# Patient Record
Sex: Male | Born: 1990 | Race: Black or African American | Hispanic: No | Marital: Single | Smoking: Current every day smoker
Health system: Southern US, Community
[De-identification: ages and names within clinical notes are randomized; demographics above are authoritative.]

---
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
## 2014-02-10 ENCOUNTER — Emergency Department (HOSPITAL_COMMUNITY)
Admission: EM | Admit: 2014-02-10 | Discharge: 2014-02-10 | Disposition: A | Discharge status: Home / Self Care | Payer: No Typology Code available for payment source | Attending: Emergency Medicine | Admitting: Emergency Medicine

## 2014-02-10 ENCOUNTER — Encounter (HOSPITAL_COMMUNITY): Payer: Self-pay | Admitting: Emergency Medicine

## 2014-02-10 DIAGNOSIS — M545 Low back pain, unspecified: Secondary | ICD-10-CM | POA: Insufficient documentation

## 2014-02-10 DIAGNOSIS — Y92410 Unspecified street and highway as the place of occurrence of the external cause: Secondary | ICD-10-CM | POA: Insufficient documentation

## 2014-02-10 DIAGNOSIS — S0993XA Unspecified injury of face, initial encounter: Secondary | ICD-10-CM | POA: Insufficient documentation

## 2014-02-10 DIAGNOSIS — Y9389 Activity, other specified: Secondary | ICD-10-CM | POA: Insufficient documentation

## 2014-02-10 DIAGNOSIS — F17200 Nicotine dependence, unspecified, uncomplicated: Secondary | ICD-10-CM | POA: Insufficient documentation

## 2014-02-10 DIAGNOSIS — S199XXA Unspecified injury of neck, initial encounter: Principal | ICD-10-CM

## 2014-02-10 MED ORDER — IBUPROFEN 200 MG PO TABS: 600.0000 mg | ORAL_TABLET | Freq: Once | ORAL | Status: DC

## 2014-02-10 MED ORDER — IBUPROFEN 600 MG PO TABS: 600.0000 mg | ORAL_TABLET | Freq: Four times a day (QID) | ORAL | Status: DC | PRN
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
## 2014-02-11 NOTE — ED Provider Notes (Signed)
Medical screening examination/treatment/procedure(s) were performed by non-physician practitioner and as supervising physician I was immediately available for consultation/collaboration.   Ashan Cueva L Kaydynce Pat, MD 02/11/14 1250 

## 2021-12-10 ENCOUNTER — Encounter (HOSPITAL_COMMUNITY): Payer: Self-pay | Admitting: Emergency Medicine

## 2021-12-10 ENCOUNTER — Ambulatory Visit (HOSPITAL_COMMUNITY)
Admission: EM | Admit: 2021-12-10 | Discharge: 2021-12-10 | Disposition: A | Discharge status: Home / Self Care | Payer: BC Managed Care – PPO | Attending: Internal Medicine | Admitting: Internal Medicine

## 2021-12-10 ENCOUNTER — Other Ambulatory Visit: Payer: Self-pay

## 2021-12-10 DIAGNOSIS — L739 Follicular disorder, unspecified: Secondary | ICD-10-CM

## 2021-12-10 MED ORDER — IBUPROFEN 600 MG PO TABS: 600.0000 mg | ORAL_TABLET | Freq: Four times a day (QID) | ORAL | 0 refills | Status: DC | PRN

## 2021-12-10 MED ORDER — DOXYCYCLINE HYCLATE 100 MG PO CAPS: 100.0000 mg | ORAL_CAPSULE | Freq: Two times a day (BID) | ORAL | 0 refills | Status: AC

## 2021-12-10 NOTE — ED Triage Notes (Signed)
Pt c/o knot in left axilla for a couple days. Reports pain. Denies drainage.

## 2021-12-10 NOTE — Discharge Instructions (Addendum)
Warm compress Take ibuprofen as needed for pain Take antibiotics If you experience worsening pain, increasing size and swelling or fever please return to urgent care to be reevaluated.

## 2021-12-13 NOTE — ED Provider Notes (Signed)
[facility]    CSN: [id number] Arrival date & time: 12/10/21  1541      History   Chief Complaint Chief Complaint  Patient presents with   Abscess    HPI John Ballard is a 31 y.o. male comes to the urgent care with 2-day history of painful swelling in the left axilla.  No discharge.  No trauma to the left axilla.  Patient denies any history of similar episodes in the past.  Pain is throbbing, constant, aggravated by palpation with no known relieving factors.  He has not tried any over-the-counter medications.   HPI  History reviewed. No pertinent past medical history.  There are no problems to display for this patient.   History reviewed. No pertinent surgical history.     Home Medications    Prior to Admission medications   Medication Sig Start Date End Date Taking? Authorizing Provider  doxycycline (VIBRAMYCIN) 100 MG capsule Take 1 capsule (100 mg total) by mouth 2 (two) times daily for 7 days. 12/10/21 12/17/21 Yes Muhammed Teutsch, Myrene Galas, MD  ibuprofen (ADVIL) 600 MG tablet Take 1 tablet (600 mg total) by mouth every 6 (six) hours as needed. 12/10/21   Aahna Rossa, Myrene Galas, MD    Family History No family history on file.  Social History Social History   Tobacco Use   Smoking status: Every Day  Substance Use Topics   Alcohol use: Yes     Allergies   Patient has no known allergies.   Review of Systems Review of Systems  Skin:  Negative for color change, pallor, rash and wound.    Physical Exam Triage Vital Signs ED Triage Vitals  Enc Vitals Group     BP 12/10/21 1632 105/69     Pulse Rate 12/10/21 1632 68     Resp 12/10/21 1632 14     Temp 12/10/21 1632 98.1 F (36.7 C)     Temp Source 12/10/21 1632 Oral     SpO2 12/10/21 1632 97 %     Weight --      Height --      Head Circumference --      Peak Flow --      Pain Score 12/10/21 1630 3     Pain Loc --      Pain Edu? --      Excl. in [place]? --    No data found.  Updated Vital  Signs BP 105/69    Pulse 68    Temp 98.1 F (36.7 C) (Oral)    Resp 14    SpO2 97%   Visual Acuity Right Eye Distance:   Left Eye Distance:   Bilateral Distance:    Right Eye Near:   Left Eye Near:    Bilateral Near:     Physical Exam Vitals and nursing note reviewed.  Constitutional:      General: He is not in acute distress.    Appearance: He is not ill-appearing.  Cardiovascular:     Rate and Rhythm: Normal rate and regular rhythm.  Skin:    Comments: Tender nodule in the left axilla.  No fluctuance noted.  No discharge noted.  No overlying erythema.  Nodule measures about half an inch in the longest diameter.  It is not attached to underlying tissue.  Neurological:     Mental Status: He is alert.     UC Treatments / Results  Labs (all labs ordered are listed, but only abnormal results are displayed) Labs  Reviewed - No data to display  EKG   Radiology No results found.  Procedures Procedures (including critical care time)  Medications Ordered in UC Medications - No data to display  Initial Impression / Assessment and Plan / UC Course  I have reviewed the triage vital signs and the nursing notes.  Pertinent labs & imaging results that were available during my care of the patient were reviewed by me and considered in my medical decision making (see chart for details).     1.  Folliculitis of the left axilla: Warm compress Ibuprofen as needed for pain Doxycycline 100 mg twice daily for 7 days Return to urgent care if pain and swelling worsens or if there is drainable abscess Final Clinical Impressions(s) / UC Diagnoses   Final diagnoses:  Folliculitis of left axilla     Discharge Instructions      Warm compress Take ibuprofen as needed for pain Take antibiotics If you experience worsening pain, increasing size and swelling or fever please return to urgent care to be reevaluated.   ED Prescriptions     Medication Sig Dispense Auth. Provider    doxycycline (VIBRAMYCIN) 100 MG capsule Take 1 capsule (100 mg total) by mouth 2 (two) times daily for 7 days. 14 capsule Yousra Ivens, Myrene Galas, MD   ibuprofen (ADVIL) 600 MG tablet Take 1 tablet (600 mg total) by mouth every 6 (six) hours as needed. 30 tablet Patrisha Hausmann, Myrene Galas, MD      PDMP not reviewed this encounter.   Chase Picket, MD 12/13/21 1728

## 2021-12-21 ENCOUNTER — Other Ambulatory Visit: Payer: Self-pay

## 2021-12-21 ENCOUNTER — Ambulatory Visit (HOSPITAL_COMMUNITY)
Admission: EM | Admit: 2021-12-21 | Discharge: 2021-12-21 | Disposition: A | Discharge status: Home / Self Care | Payer: BC Managed Care – PPO | Attending: Family Medicine | Admitting: Family Medicine

## 2021-12-21 ENCOUNTER — Encounter (HOSPITAL_COMMUNITY): Payer: Self-pay

## 2021-12-21 DIAGNOSIS — L739 Follicular disorder, unspecified: Secondary | ICD-10-CM | POA: Diagnosis not present

## 2021-12-21 NOTE — ED Triage Notes (Signed)
Pt c/o multiple knots under bilateral axilla for 3 weeks

## 2021-12-21 NOTE — ED Provider Notes (Signed)
[place]    CSN: [id number] Arrival date & time: 12/21/21  1153      History   Chief Complaint Chief Complaint  Patient presents with   Lymphadenopathy    HPI John Ballard is a 31 y.o. male.   He was taking a shower, and noted a lump under the left arm pit several weeks week.  Seen in the UC, given abx, recommended warm compresses.  The swelling went down, but did not go away;  the tenderness is better, but depends on what he is doing.  He noted a lump under the right arm pit in the last 2 days;  this is not swollen yet at this time.  No other enlarged lymph nodes or lumps anywhere else;  No fevers chills, feeling well otherwise;  For work he does a lot of overhead lifting, and wonders if that is related;  No open sores;   History reviewed. No pertinent past medical history.  There are no problems to display for this patient.   History reviewed. No pertinent surgical history.     Home Medications    Prior to Admission medications   Medication Sig Start Date End Date Taking? Authorizing Provider  ibuprofen (ADVIL) 600 MG tablet Take 1 tablet (600 mg total) by mouth every 6 (six) hours as needed. 12/10/21   Lamptey, Myrene Galas, MD    Family History History reviewed. No pertinent family history.  Social History Social History   Tobacco Use   Smoking status: Every Day  Substance Use Topics   Alcohol use: Yes     Allergies   Patient has no known allergies.   Review of Systems Review of Systems  Constitutional: Negative.   HENT: Negative.    Respiratory: Negative.    Cardiovascular: Negative.   Gastrointestinal: Negative.   Allergic/Immunologic: Negative.   Hematological:  Positive for adenopathy.    Physical Exam Triage Vital Signs ED Triage Vitals  Enc Vitals Group     BP 12/21/21 1202 120/75     Pulse Rate 12/21/21 1202 69     Resp 12/21/21 1202 14     Temp 12/21/21 1202 98.1 F (36.7 C)     Temp Source 12/21/21 1202 Oral      SpO2 12/21/21 1202 96 %     Weight --      Height --      Head Circumference --      Peak Flow --      Pain Score 12/21/21 1203 0     Pain Loc --      Pain Edu? --      Excl. in [place]? --    No data found.  Updated Vital Signs BP 120/75 (BP Location: Left Arm)    Pulse 69    Temp 98.1 F (36.7 C) (Oral)    Resp 14    SpO2 96%   Visual Acuity Right Eye Distance:   Left Eye Distance:   Bilateral Distance:    Right Eye Near:   Left Eye Near:    Bilateral Near:     Physical Exam Constitutional:      Appearance: Normal appearance.  Cardiovascular:     Rate and Rhythm: Normal rate and regular rhythm.  Pulmonary:     Effort: Pulmonary effort is normal.     Breath sounds: Normal breath sounds.  Musculoskeletal:     Cervical back: Normal range of motion and neck supple.  Lymphadenopathy:     Cervical: No  cervical adenopathy.     Upper Body:     Right upper body: No supraclavicular or axillary adenopathy.     Left upper body: No supraclavicular or axillary adenopathy.  Skin:    General: Skin is warm.     Comments: There are is one small superficial lump just under the skin on the right axilla;  non tender;  there is another superficial lump just under the skin at the left axilla; non tender;   Neurological:     Mental Status: He is alert.     UC Treatments / Results  Labs (all labs ordered are listed, but only abnormal results are displayed) Labs Reviewed - No data to display  EKG   Radiology No results found.  Procedures Procedures (including critical care time)  Medications Ordered in UC Medications - No data to display  Initial Impression / Assessment and Plan / UC Course  I have reviewed the triage vital signs and the nursing notes.  Pertinent labs & imaging results that were available during my care of the patient were reviewed by me and considered in my medical decision making (see chart for details).  Small superficial lumps under the skin bilaterally;   These are not lymph nodes;  These also do not seem to be infected at this time.  Will monitor for now if they do seem infected will follow up in the urgent care;    Final Clinical Impressions(s) / UC Diagnoses   Final diagnoses:  Folliculitis     Discharge Instructions      You were seen today for folliculitis of the armpits.  These are not enlarged lymph nodes.  There is no evidence of infection at this time so I will not give you an oral antibiotic.  One option is to trial over the counter wash, such as Hibiclens, to see if this helps.  This will be at your local pharmacy.  If any of these areas become infected (redness, warmth, tenderness) then please return to the urgent care.     ED Prescriptions   None    PDMP not reviewed this encounter.   Rondel Oh, MD 12/21/21 1234

## 2021-12-21 NOTE — Discharge Instructions (Signed)
You were seen today for folliculitis of the armpits.  These are not enlarged lymph nodes.  There is no evidence of infection at this time so I will not give you an oral antibiotic.  One option is to trial over the counter wash, such as Hibiclens, to see if this helps.  This will be at your local pharmacy.  If any of these areas become infected (redness, warmth, tenderness) then please return to the urgent care.

## 2022-01-07 ENCOUNTER — Ambulatory Visit (HOSPITAL_COMMUNITY)
Admission: EM | Admit: 2022-01-07 | Discharge: 2022-01-07 | Disposition: A | Discharge status: Home / Self Care | Payer: BC Managed Care – PPO | Attending: Family Medicine | Admitting: Family Medicine

## 2022-01-07 ENCOUNTER — Encounter (HOSPITAL_COMMUNITY): Payer: Self-pay | Admitting: Emergency Medicine

## 2022-01-07 ENCOUNTER — Other Ambulatory Visit: Payer: Self-pay

## 2022-01-07 DIAGNOSIS — M549 Dorsalgia, unspecified: Secondary | ICD-10-CM

## 2022-01-07 DIAGNOSIS — R509 Fever, unspecified: Secondary | ICD-10-CM | POA: Diagnosis not present

## 2022-01-07 LAB — POC INFLUENZA A AND B ANTIGEN (URGENT CARE ONLY)
INFLUENZA A ANTIGEN, POC: NEGATIVE
INFLUENZA B ANTIGEN, POC: NEGATIVE

## 2022-01-07 MED ORDER — KETOROLAC TROMETHAMINE 30 MG/ML IJ SOLN
INTRAMUSCULAR | Status: AC
  Filled 2022-01-07: qty 1

## 2022-01-07 MED ORDER — LIDOCAINE VISCOUS HCL 2 % MT SOLN
OROMUCOSAL | Status: AC
  Filled 2022-01-07: qty 15

## 2022-01-07 MED ORDER — IBUPROFEN 600 MG PO TABS: 600.0000 mg | ORAL_TABLET | Freq: Three times a day (TID) | ORAL | 0 refills | Status: AC | PRN

## 2022-01-07 MED ORDER — TIZANIDINE HCL 4 MG PO TABS: 4.0000 mg | ORAL_TABLET | Freq: Three times a day (TID) | ORAL | 0 refills | Status: AC | PRN

## 2022-01-07 MED ORDER — KETOROLAC TROMETHAMINE 30 MG/ML IJ SOLN
30.0000 mg | Freq: Once | INTRAMUSCULAR | Status: AC
  Administered 2022-01-07: 30 mg via INTRAMUSCULAR

## 2022-01-07 NOTE — ED Triage Notes (Signed)
Pt reports all over back pain that started this morning. Pt states he needs a refill for Ibuprofen.

## 2022-01-07 NOTE — Discharge Instructions (Addendum)
Ibuprofen 600 mg, 1 every 8 hours as needed for pain.  Tizanidine 4 mg 1 every 8 hours as needed for muscle pain or spasm.  You can use a heating pad on any sore areas.  You were given a shot of Toradol 30 mg today

## 2022-01-07 NOTE — ED Provider Notes (Signed)
[place]    CSN: [id number] Arrival date & time: 01/07/22  1121      History   Chief Complaint Chief Complaint  Patient presents with   Back Pain    HPI John Ballard is a 31 y.o. male.    Back Pain Here for sharp pain that begins in the nape of his neck and extends down to his lumbosacral area.  This began this morning.  No injury noted.  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  No upper respiratory symptoms.  He does note some fever and chills today.  No vomiting or diarrhea. No syncope so far.  History reviewed. No pertinent past medical history.  There are no problems to display for this patient.   History reviewed. No pertinent surgical history.     Home Medications    Prior to Admission medications   Medication Sig Start Date End Date Taking? Authorizing Provider  tiZANidine (ZANAFLEX) 4 MG tablet Take 1 tablet (4 mg total) by mouth every 8 (eight) hours as needed for muscle spasms. 01/07/22  Yes Barrett Henle, MD  ibuprofen (ADVIL) 600 MG tablet Take 1 tablet (600 mg total) by mouth every 8 (eight) hours as needed. 01/07/22   Barrett Henle, MD    Family History Family History  Problem Relation Age of Onset   Healthy Mother    Healthy Father     Social History Social History   Tobacco Use   Smoking status: Every Day  Substance Use Topics   Alcohol use: Yes     Allergies   Patient has no known allergies.   Review of Systems Review of Systems  Musculoskeletal:  Positive for back pain.    Physical Exam Triage Vital Signs ED Triage Vitals  Enc Vitals Group     BP 01/07/22 1222 123/72     Pulse Rate 01/07/22 1222 99     Resp 01/07/22 1222 16     Temp 01/07/22 1222 (!) 100.9 F (38.3 C)     Temp Source 01/07/22 1222 Oral     SpO2 01/07/22 1222 96 %     Weight 01/07/22 1220 119 lb 14.9 oz (54.4 kg)     Height 01/07/22 1220 5\' 8"  (1.727 m)     Head Circumference --      Peak Flow --       Pain Score 01/07/22 1220 8     Pain Loc --      Pain Edu? --      Excl. in [place]? --    No data found.  Updated Vital Signs BP 123/72 (BP Location: Right Arm)    Pulse 99    Temp (!) 100.9 F (38.3 C) (Oral)    Resp 16    Ht 5\' 8"  (1.727 m)    Wt 54.4 kg    SpO2 96%    BMI 18.24 kg/m   Visual Acuity Right Eye Distance:   Left Eye Distance:   Bilateral Distance:    Right Eye Near:   Left Eye Near:    Bilateral Near:     Physical Exam Vitals reviewed.  Constitutional:      General: He is not in acute distress.    Appearance: He is not toxic-appearing.  HENT:     Nose: Nose normal.     Mouth/Throat:     Mouth: Mucous membranes are moist.  Eyes:     Extraocular Movements: Extraocular movements  intact.     Pupils: Pupils are equal, round, and reactive to light.  Cardiovascular:     Rate and Rhythm: Normal rate.     Heart sounds: No murmur heard. Pulmonary:     Effort: Pulmonary effort is normal.     Breath sounds: Normal breath sounds. No wheezing or rhonchi.  Musculoskeletal:        General: Tenderness (upper and lower back) present.     Cervical back: Neck supple.  Lymphadenopathy:     Cervical: No cervical adenopathy.  Skin:    Coloration: Skin is not jaundiced or pale.  Neurological:     General: No focal deficit present.     Mental Status: He is alert and oriented to person, place, and time.  Psychiatric:        Behavior: Behavior normal.     UC Treatments / Results  Labs (all labs ordered are listed, but only abnormal results are displayed) Labs Reviewed  POC INFLUENZA A AND B ANTIGEN (URGENT CARE ONLY)    EKG   Radiology No results found.  Procedures Procedures (including critical care time)  Medications Ordered in UC Medications  ketorolac (TORADOL) 30 MG/ML injection 30 mg (30 mg Intramuscular Given 01/07/22 1259)    Initial Impression / Assessment and Plan / UC Course  I have reviewed the triage vital signs and the nursing  notes.  Pertinent labs & imaging results that were available during my care of the patient were reviewed by me and considered in my medical decision making (see chart for details).     And seems it was more chills from the pain and then fever that he had felt.  Flu test is negative.  We will treat with ibuprofen and tizanidine as needed. Final Clinical Impressions(s) / UC Diagnoses   Final diagnoses:  Acute back pain, unspecified back location, unspecified back pain laterality  Fever, unspecified     Discharge Instructions      Ibuprofen 600 mg, 1 every 8 hours as needed for pain.  Tizanidine 4 mg 1 every 8 hours as needed for muscle pain or spasm.  You can use a heating pad on any sore areas.  You were given a shot of Toradol 30 mg today     ED Prescriptions     Medication Sig Dispense Auth. Provider   ibuprofen (ADVIL) 600 MG tablet Take 1 tablet (600 mg total) by mouth every 8 (eight) hours as needed. 60 tablet Malikye Reppond, Gwenlyn Perking, MD   tiZANidine (ZANAFLEX) 4 MG tablet Take 1 tablet (4 mg total) by mouth every 8 (eight) hours as needed for muscle spasms. 30 tablet Haile Toppins, Gwenlyn Perking, MD      I have reviewed the PDMP during this encounter.   Barrett Henle, MD 01/07/22 [phone]

## 2022-01-09 ENCOUNTER — Encounter (HOSPITAL_COMMUNITY): Payer: Self-pay

## 2022-01-09 ENCOUNTER — Ambulatory Visit (HOSPITAL_COMMUNITY)
Admission: EM | Admit: 2022-01-09 | Discharge: 2022-01-09 | Disposition: A | Discharge status: Home / Self Care | Payer: BC Managed Care – PPO | Attending: Family Medicine | Admitting: Family Medicine

## 2022-01-09 ENCOUNTER — Other Ambulatory Visit: Payer: Self-pay

## 2022-01-09 DIAGNOSIS — R509 Fever, unspecified: Secondary | ICD-10-CM | POA: Diagnosis not present

## 2022-01-09 DIAGNOSIS — M549 Dorsalgia, unspecified: Secondary | ICD-10-CM | POA: Diagnosis not present

## 2022-01-09 DIAGNOSIS — U071 COVID-19: Secondary | ICD-10-CM | POA: Diagnosis not present

## 2022-01-09 DIAGNOSIS — M25559 Pain in unspecified hip: Secondary | ICD-10-CM | POA: Insufficient documentation

## 2022-01-09 LAB — CBC WITH DIFFERENTIAL/PLATELET
Abs Immature Granulocytes: 0.02 10*3/uL (ref 0.00–0.07)
Basophils Absolute: 0 10*3/uL (ref 0.0–0.1)
Basophils Relative: 0 %
Eosinophils Absolute: 0 10*3/uL (ref 0.0–0.5)
Eosinophils Relative: 0 %
HCT: 47.9 % (ref 39.0–52.0)
Hemoglobin: 15.6 g/dL (ref 13.0–17.0)
Immature Granulocytes: 1 %
Lymphocytes Relative: 43 %
Lymphs Abs: 1.3 10*3/uL (ref 0.7–4.0)
MCH: 31.8 pg (ref 26.0–34.0)
MCHC: 32.6 g/dL (ref 30.0–36.0)
MCV: 97.8 fL (ref 80.0–100.0)
Monocytes Absolute: 0.3 10*3/uL (ref 0.1–1.0)
Monocytes Relative: 10 %
Neutro Abs: 1.4 10*3/uL — ABNORMAL LOW (ref 1.7–7.7)
Neutrophils Relative %: 46 %
Platelets: UNDETERMINED 10*3/uL (ref 150–400)
RBC: 4.9 MIL/uL (ref 4.22–5.81)
RDW: 11.5 % (ref 11.5–15.5)
WBC: 3.1 10*3/uL — ABNORMAL LOW (ref 4.0–10.5)
nRBC: 0 % (ref 0.0–0.2)

## 2022-01-09 LAB — COMPREHENSIVE METABOLIC PANEL
ALT: 21 U/L (ref 0–44)
AST: 35 U/L (ref 15–41)
Albumin: 4.1 g/dL (ref 3.5–5.0)
Alkaline Phosphatase: 56 U/L (ref 38–126)
Anion gap: 10 (ref 5–15)
BUN: <5 mg/dL — ABNORMAL LOW (ref 6–20)
CO2: 26 mmol/L (ref 22–32)
Calcium: 9 mg/dL (ref 8.9–10.3)
Chloride: 101 mmol/L (ref 98–111)
Creatinine, Ser: 1.2 mg/dL (ref 0.61–1.24)
GFR, Estimated: >60 mL/min (ref >60)
Glucose, Bld: 95 mg/dL (ref 70–99)
Potassium: 3.9 mmol/L (ref 3.5–5.1)
Sodium: 137 mmol/L (ref 135–145)
Total Bilirubin: 0.9 mg/dL (ref 0.3–1.2)
Total Protein: 6.8 g/dL (ref 6.5–8.1)

## 2022-01-09 MED ORDER — ACETAMINOPHEN 325 MG PO TABS
ORAL_TABLET | ORAL | Status: AC
  Filled 2022-01-09: qty 2

## 2022-01-09 MED ORDER — ACETAMINOPHEN 325 MG PO TABS
650.0000 mg | ORAL_TABLET | Freq: Once | ORAL | Status: AC
  Administered 2022-01-09: 650 mg via ORAL

## 2022-01-09 NOTE — ED Notes (Addendum)
Upon entering room to obtain flu test that is ordered, pt informs this RN that he had a flu test when he was seen here on Friday. Specimen not obtained Randon Goldsmith, NP aware.

## 2022-01-09 NOTE — Discharge Instructions (Signed)
We are checking you for covid We are also checking some blood work and will call with any abnormal results.  You can also check your my chart for results.  Please follow up if symptoms worsen.

## 2022-01-09 NOTE — ED Triage Notes (Signed)
Pt presents with generalized body body aches and chills X 2 days.

## 2022-01-09 NOTE — ED Provider Notes (Signed)
[place]    CSN: [id number] Arrival date & time: 01/09/22  1650      History   Chief Complaint Chief Complaint  Patient presents with   Generalized Body Aches   Chills    HPI John Ballard is a 31 y.o. male.   31 year old male with continued fever, back pain, hip pain, chills. Started Friday. Has some what improved. Negative Flu testing on Friday. No N,V,D. No cough, congestion.     History reviewed. No pertinent past medical history.  There are no problems to display for this patient.   History reviewed. No pertinent surgical history.     Home Medications    Prior to Admission medications   Medication Sig Start Date End Date Taking? Authorizing Provider  ibuprofen (ADVIL) 600 MG tablet Take 1 tablet (600 mg total) by mouth every 8 (eight) hours as needed. 01/07/22   Barrett Henle, MD  tiZANidine (ZANAFLEX) 4 MG tablet Take 1 tablet (4 mg total) by mouth every 8 (eight) hours as needed for muscle spasms. 01/07/22   Barrett Henle, MD    Family History Family History  Problem Relation Age of Onset   Healthy Mother    Healthy Father     Social History Social History   Tobacco Use   Smoking status: Every Day  Substance Use Topics   Alcohol use: Yes     Allergies   Patient has no known allergies.   Review of Systems Review of Systems   Physical Exam Triage Vital Signs ED Triage Vitals  Enc Vitals Group     BP 01/09/22 1752 118/82     Pulse Rate 01/09/22 1752 95     Resp 01/09/22 1752 18     Temp 01/09/22 1752 (!) 101.4 F (38.6 C)     Temp Source 01/09/22 1752 Oral     SpO2 01/09/22 1752 100 %     Weight --      Height --      Head Circumference --      Peak Flow --      Pain Score 01/09/22 1753 6     Pain Loc --      Pain Edu? --      Excl. in [employer]? --    No data found.  Updated Vital Signs BP 118/82 (BP Location: Right Arm)    Pulse 95    Temp (!) 101.4 F (38.6 C) (Oral)    Resp 18    SpO2 100%   Visual  Acuity Right Eye Distance:   Left Eye Distance:   Bilateral Distance:    Right Eye Near:   Left Eye Near:    Bilateral Near:     Physical Exam Constitutional:      General: He is not in acute distress.    Appearance: Normal appearance. He is not ill-appearing, toxic-appearing or diaphoretic.  HENT:     Head: Normocephalic and atraumatic.     Right Ear: Tympanic membrane and ear canal normal.     Left Ear: Tympanic membrane and ear canal normal.     Nose: Nose normal.     Mouth/Throat:     Pharynx: Oropharynx is clear.  Eyes:     Conjunctiva/sclera: Conjunctivae normal.  Neck:     Comments: Generalized tenderness to the neck musculature.  Cardiovascular:     Rate and Rhythm: Normal rate and regular rhythm.  Pulmonary:     Effort: Pulmonary effort is normal.  Breath sounds: Normal breath sounds.  Musculoskeletal:     Cervical back: Tenderness present.  Skin:    General: Skin is warm and dry.  Neurological:     Mental Status: He is alert.     UC Treatments / Results  Labs (all labs ordered are listed, but only abnormal results are displayed) Labs Reviewed  SARS CORONAVIRUS 2 (TAT 6-24 HRS)  CBC WITH DIFFERENTIAL/PLATELET  COMPREHENSIVE METABOLIC PANEL    EKG   Radiology No results found.  Procedures Procedures (including critical care time)  Medications Ordered in UC Medications  acetaminophen (TYLENOL) tablet 650 mg (650 mg Oral Given 01/09/22 1755)    Initial Impression / Assessment and Plan / UC Course  I have reviewed the triage vital signs and the nursing notes.  Pertinent labs & imaging results that were available during my care of the patient were reviewed by me and considered in my medical decision making (see chart for details).     Fever- Most likely viral Covid test pending.  Drawing CBC with diff and CMP r/o other cause with aches and fever.  Labs pending.  Final Clinical Impressions(s) / UC Diagnoses   Final diagnoses:  Fever,  unspecified     Discharge Instructions      We are checking you for covid We are also checking some blood work and will call with any abnormal results.  You can also check your my chart for results.  Please follow up if symptoms worsen.     ED Prescriptions   None    PDMP not reviewed this encounter.   Orvan July, NP 01/09/22 1842

## 2022-01-10 LAB — SARS CORONAVIRUS 2 (TAT 6-24 HRS): SARS Coronavirus 2: POSITIVE — AB

## 2022-01-14 ENCOUNTER — Encounter (HOSPITAL_COMMUNITY): Payer: Self-pay | Admitting: Emergency Medicine

## 2022-01-14 ENCOUNTER — Other Ambulatory Visit: Payer: Self-pay

## 2022-01-14 ENCOUNTER — Ambulatory Visit (HOSPITAL_COMMUNITY)
Admission: EM | Admit: 2022-01-14 | Discharge: 2022-01-14 | Disposition: A | Discharge status: Home / Self Care | Payer: BC Managed Care – PPO | Attending: Family Medicine | Admitting: Family Medicine

## 2022-01-14 DIAGNOSIS — Z0189 Encounter for other specified special examinations: Secondary | ICD-10-CM | POA: Diagnosis not present

## 2022-01-14 DIAGNOSIS — Z0289 Encounter for other administrative examinations: Secondary | ICD-10-CM

## 2022-01-14 NOTE — ED Provider Notes (Signed)
John Ballard    CSN: [id number] Arrival date & time: 01/14/22  1426      History   Chief Complaint Chief Complaint  Patient presents with   Follow-up    John TONE Ballard is a 31 y.o. male.   John Patient presents today for evaluation to return back to work.  Patient was seen here on Sunday and subsequently diagnosed with COVID-19.  Patient reports his symptoms have improved and he has remained afebrile over the last 48 hours.  History reviewed. No pertinent past medical history.  There are no problems to display for this patient.   History reviewed. No pertinent surgical history.     Home Medications    Prior to Admission medications   Medication Sig Start Date End Date Taking? Authorizing Provider  ibuprofen (ADVIL) 600 MG tablet Take 1 tablet (600 mg total) by mouth every 8 (eight) hours as needed. 01/07/22   Barrett Henle, MD  tiZANidine (ZANAFLEX) 4 MG tablet Take 1 tablet (4 mg total) by mouth every 8 (eight) hours as needed for muscle spasms. 01/07/22   Barrett Henle, MD    Family History Family History  Problem Relation Age of Onset   Healthy Mother    Healthy Father     Social History Social History   Tobacco Use   Smoking status: Every Day  Substance Use Topics   Alcohol use: Yes     Allergies   Patient has no known allergies.   Review of Systems Review of Systems Pertinent negatives listed in John   Physical Exam Triage Vital Signs ED Triage Vitals  Enc Vitals Group     BP 01/14/22 1550 109/73     Pulse Rate 01/14/22 1550 62     Resp 01/14/22 1550 18     Temp 01/14/22 1550 98.1 F (36.7 C)     Temp Source 01/14/22 1550 Oral     SpO2 01/14/22 1550 98 %     Weight --      Height --      Head Circumference --      Peak Flow --      Pain Score 01/14/22 1549 0     Pain Loc --      Pain Edu? --      Excl. in [place]? --    No data found.  Updated Vital Signs BP 109/73 (BP Location: Right Arm)    Pulse 62    Temp  98.1 F (36.7 C) (Oral)    Resp 18    SpO2 98%   Visual Acuity Right Eye Distance:   Left Eye Distance:   Bilateral Distance:    Right Eye Near:   Left Eye Near:    Bilateral Near:     Physical Exam  General Appearance:    Alert, cooperative, no distress  HENT:   ENT exam normal, no neck nodes or sinus tenderness  Eyes:    PERRL, conjunctiva/corneas clear, EOM's intact       Lungs:     Clear to auscultation bilaterally, respirations unlabored  Heart:    Regular rate and rhythm  Neurologic:   Awake, alert, oriented x 3. No apparent focal neurological           defect.        UC Treatments / Results  Labs (all labs ordered are listed, but only abnormal results are displayed) Labs Reviewed - No data to display  EKG   Radiology No results  found.  Procedures Procedures (including critical care time)  Medications Ordered in UC Medications - No data to display  Initial Impression / Assessment and Plan / UC Course  I have reviewed the triage vital signs and the nursing notes.  Pertinent labs & imaging results that were available during my care of the patient were reviewed by me and considered in my medical decision making (see chart for details).    Patient here for return to work note. Asymptomatic of any residual COVID symptoms.  RTC PRN  Final Clinical Impressions(s) / UC Diagnoses   Final diagnoses:  Encounter to obtain excuse from work   Discharge Instructions   None    ED Prescriptions   None    PDMP not reviewed this encounter.   Scot Jun, FNP 01/14/22 [phone]

## 2022-01-14 NOTE — ED Triage Notes (Signed)
Pt reports requesting a follow up appointment to return to work.  Pt states he was dx with Covid 2/5
